# Patient Record
Sex: Female | Born: 1950 | Hispanic: No | Marital: Married | Smoking: Never smoker
Health system: Southern US, Community
[De-identification: ages and names within clinical notes are randomized; demographics above are authoritative.]

## PROBLEM LIST (undated history)

## (undated) DIAGNOSIS — N200 Calculus of kidney: Secondary | ICD-10-CM

---

## 2014-11-17 ENCOUNTER — Encounter (HOSPITAL_COMMUNITY): Payer: Self-pay | Admitting: Emergency Medicine

## 2014-11-17 ENCOUNTER — Emergency Department (HOSPITAL_COMMUNITY)
Admission: EM | Admit: 2014-11-17 | Discharge: 2014-11-17 | Disposition: A | Discharge status: Home / Self Care | Payer: Self-pay | Attending: Emergency Medicine | Admitting: Emergency Medicine

## 2014-11-17 ENCOUNTER — Emergency Department (HOSPITAL_COMMUNITY): Payer: Self-pay

## 2014-11-17 DIAGNOSIS — R5383 Other fatigue: Secondary | ICD-10-CM | POA: Insufficient documentation

## 2014-11-17 DIAGNOSIS — R42 Dizziness and giddiness: Secondary | ICD-10-CM | POA: Insufficient documentation

## 2014-11-17 DIAGNOSIS — Z87442 Personal history of urinary calculi: Secondary | ICD-10-CM | POA: Insufficient documentation

## 2014-11-17 HISTORY — DX: Calculus of kidney: N20.0

## 2014-11-17 LAB — CBC WITH DIFFERENTIAL/PLATELET
BASOS ABS: 0 10*3/uL (ref 0.0–0.1)
Basophils Relative: 0 % (ref 0–1)
Eosinophils Absolute: 0.1 10*3/uL (ref 0.0–0.7)
Eosinophils Relative: 1 % (ref 0–5)
HEMATOCRIT: 39.5 % (ref 36.0–46.0)
Hemoglobin: 13.5 g/dL (ref 12.0–15.0)
LYMPHS ABS: 1.7 10*3/uL (ref 0.7–4.0)
LYMPHS PCT: 19 % (ref 12–46)
MCH: 30.6 pg (ref 26.0–34.0)
MCHC: 34.2 g/dL (ref 30.0–36.0)
MCV: 89.6 fL (ref 78.0–100.0)
MONO ABS: 0.3 10*3/uL (ref 0.1–1.0)
MONOS PCT: 4 % (ref 3–12)
NEUTROS ABS: 6.7 10*3/uL (ref 1.7–7.7)
Neutrophils Relative %: 76 % (ref 43–77)
Platelets: 253 10*3/uL (ref 150–400)
RBC: 4.41 MIL/uL (ref 3.87–5.11)
RDW: 12.2 % (ref 11.5–15.5)
WBC: 8.9 10*3/uL (ref 4.0–10.5)

## 2014-11-17 LAB — BASIC METABOLIC PANEL
ANION GAP: 8 (ref 5–15)
BUN: 10 mg/dL (ref 6–20)
CHLORIDE: 108 mmol/L (ref 101–111)
CO2: 24 mmol/L (ref 22–32)
Calcium: 9.2 mg/dL (ref 8.9–10.3)
Creatinine, Ser: 0.93 mg/dL (ref 0.44–1.00)
GFR calc Af Amer: >60 mL/min (ref >60)
GFR calc non Af Amer: >60 mL/min (ref >60)
GLUCOSE: 118 mg/dL — AB (ref 65–99)
POTASSIUM: 4 mmol/L (ref 3.5–5.1)
Sodium: 140 mmol/L (ref 135–145)

## 2014-11-17 LAB — HEPATIC FUNCTION PANEL
ALK PHOS: 62 U/L (ref 38–126)
ALT: 14 U/L (ref 14–54)
AST: 22 U/L (ref 15–41)
Albumin: 4 g/dL (ref 3.5–5.0)
BILIRUBIN INDIRECT: 0.7 mg/dL (ref 0.3–0.9)
Bilirubin, Direct: 0.2 mg/dL (ref 0.1–0.5)
TOTAL PROTEIN: 7.1 g/dL (ref 6.5–8.1)
Total Bilirubin: 0.9 mg/dL (ref 0.3–1.2)

## 2014-11-17 LAB — I-STAT TROPONIN, ED: Troponin i, poc: 0 ng/mL (ref 0.00–0.08)

## 2014-11-17 MED ORDER — ONDANSETRON HCL 4 MG/2ML IJ SOLN
4.0000 mg | Freq: Once | INTRAMUSCULAR | Status: DC
  Filled 2014-11-17: qty 2

## 2014-11-17 MED ORDER — SODIUM CHLORIDE 0.9 % IV BOLUS (SEPSIS): 1000.0000 mL | Freq: Once | INTRAVENOUS | Status: DC

## 2014-11-17 MED ORDER — SODIUM CHLORIDE 0.9 % IV BOLUS (SEPSIS)
1000.0000 mL | Freq: Once | INTRAVENOUS | Status: AC
  Administered 2014-11-17: 1000 mL via INTRAVENOUS

## 2014-11-17 NOTE — ED Notes (Signed)
Pt arrives POV from home with c/o fatigue and weakness off and on for the last 4 months. This AM was in the shower, began feeling dizzy and nearly passed out. Pt alert, oriented x4, NAd at present.

## 2014-11-17 NOTE — ED Notes (Signed)
Pt reports feeling weak for several months. Pt normally wakes up, meditates then showers before eating. Today patient almost passed out in the shower. Pt reports tingling to back of head for three months. Pt also reports palpitations and feeling nervous all the time. [place] and wanted to get checked out prior to her next trip. Pt also concerned that two family members had the same symptoms and were diagnosed with blood cancer.

## 2014-11-17 NOTE — Discharge Instructions (Signed)
Follow up with [facility] for further evaluation. Refer to attached documents for more information. Return to the ED with worsening or concerning symptoms.

## 2014-11-17 NOTE — ED Provider Notes (Signed)
CSN: [id number]     Arrival date & time 11/17/14  1191 History   First MD Initiated Contact with Patient 11/17/14 0957     Chief Complaint  Patient presents with  . Fatigue  . Near Syncope     (Consider location/radiation/quality/duration/timing/severity/associated sxs/prior Treatment) Patient is a 64 y.o. female presenting with dizziness. The history is provided by the patient. No language interpreter was used.  Dizziness Quality:  Lightheadedness Severity:  Moderate Onset quality:  Sudden Duration:  4 months Timing:  Intermittent Progression:  Unchanged Chronicity:  New Context: standing up   Relieved by:  Lying down Worsened by:  Standing up Ineffective treatments:  None tried Associated symptoms: no chest pain, no diarrhea, no nausea, no palpitations, no shortness of breath, no vomiting and no weakness   Risk factors: no anemia, no heart disease, no hx of stroke, no hx of vertigo, no Meniere's disease, no multiple medications and no new medications     Past Medical History  Diagnosis Date  . Kidney stone    No past surgical history on file. No family history on file. Social History  Substance Use Topics  . Smoking status: Never Smoker   . Smokeless tobacco: Not on file  . Alcohol Use: No   OB History    No data available     Review of Systems  Constitutional: Positive for fatigue. Negative for fever and chills.  HENT: Negative for trouble swallowing.   Eyes: Negative for visual disturbance.  Respiratory: Negative for shortness of breath.   Cardiovascular: Negative for chest pain and palpitations.  Gastrointestinal: Negative for nausea, vomiting, abdominal pain and diarrhea.  Genitourinary: Negative for dysuria and difficulty urinating.  Musculoskeletal: Negative for arthralgias and neck pain.  Skin: Negative for color change.  Neurological: Positive for light-headedness. Negative for dizziness and weakness.  Psychiatric/Behavioral: Negative for dysphoric  mood.      Allergies  Review of patient's allergies indicates no known allergies.  Home Medications   Prior to Admission medications   Not on File   Ht 5\' 3"  (1.6 m)  Wt 160 lb (72.576 kg)  BMI 28.35 kg/m2 Physical Exam  Constitutional: She is oriented to person, place, and time. She appears well-developed and well-nourished. No distress.  HENT:  Head: Normocephalic and atraumatic.  Eyes: Conjunctivae and EOM are normal. Pupils are equal, round, and reactive to light.  Neck: Normal range of motion.  Cardiovascular: Normal rate and regular rhythm.  Exam reveals no gallop and no friction rub.   No murmur heard. Pulmonary/Chest: Effort normal and breath sounds normal. She has no wheezes. She has no rales. She exhibits no tenderness.  Abdominal: Soft. She exhibits no distension. There is no tenderness. There is no rebound.  Musculoskeletal: Normal range of motion.  Neurological: She is alert and oriented to person, place, and time. No cranial nerve deficit. Coordination normal.  Speech is goal-oriented. Moves limbs without ataxia.   Skin: Skin is warm and dry.  Psychiatric: She has a normal mood and affect. Her behavior is normal.  Nursing note and vitals reviewed.   ED Course  Procedures (including critical care time) Labs Review Labs Reviewed  BASIC METABOLIC PANEL - Abnormal; Notable for the following:    Glucose, Bld 118 (*)    All other components within normal limits  CBC WITH DIFFERENTIAL/PLATELET  HEPATIC FUNCTION PANEL  I-STAT TROPOININ, ED  I-STAT TROPOININ, ED    Imaging Review Ct Head Wo Contrast  11/17/2014   CLINICAL DATA:  Fatigue  and weakness for 4 months. Dizziness and near syncope in the shower this morning. Tingling sensation along the posterior head.  EXAM: CT HEAD WITHOUT CONTRAST  TECHNIQUE: Contiguous axial images were obtained from the base of the skull through the vertex without intravenous contrast.  COMPARISON:  None.  FINDINGS: Globus pallidus  nucleus calcification bilaterally, likely physiologic.  Otherwise, the brainstem, cerebellum, cerebral peduncles, thalamus, basal ganglia, basilar cisterns, and ventricular system appear within normal limits. No intracranial hemorrhage, mass lesion, or acute CVA. Chronic bilateral ethmoid, maxillary, sphenoid, and frontal sinusitis.  IMPRESSION: 1. Mild chronic paranasal sinusitis. 2. No significant intracranial abnormality to explain the patient's symptoms.   Electronically Signed   By: Gaylyn Rong M.D.   On: 11/17/2014 11:29   I have personally reviewed and evaluated these images and lab results as part of my medical decision-making.   EKG Interpretation   Date/Time:  Thursday November 17 2014 09:45:56 EDT Ventricular Rate:  79 PR Interval:  137 QRS Duration: 80 QT Interval:  369 QTC Calculation: 423 R Axis:   48 Text Interpretation:  Sinus rhythm Normal ECG No previous tracing  Confirmed by Denton Lank  MD, Caryn Bee ([id number]) on 11/17/2014 9:54:27 AM      MDM   Final diagnoses:  Positional lightheadedness   12:05 PM Patient's symptoms appears to be positional and likely related to orthostatic hypotension. Patient's labs and CT unremarkable for acute changes. Orthostatic vital signs suggest some hypovolemia. Patient will have 1 liter bolus and follow up with [facility]. Patient recommended to slightly increase dietary salt intake and to take her time when getting up from a seated or lying position. No neuro deficits.     Sabrina Beck, PA-C 11/17/14 1249  Cathren Laine, MD 11/18/14 [phone]

## 2016-08-09 IMAGING — CT CT HEAD W/O CM
1 of 2 series · 16 of 30 positions shown, 20 images · non-contrast
Comparison: None.

CLINICAL DATA: Fatigue and weakness for 4 months. Dizziness and
near syncope in the shower this morning. Tingling sensation along
the posterior head.

EXAM:
CT HEAD WITHOUT CONTRAST
TECHNIQUE: Contiguous axial images were obtained from the base of the skull
through the vertex without intravenous contrast.

[Series 3: head 2.0 h70h · axial · 0.40mm/px · z∈[+1300,+1438]mm · 16 of 77 slices shown, 20 images]
[im 4/77  brain]
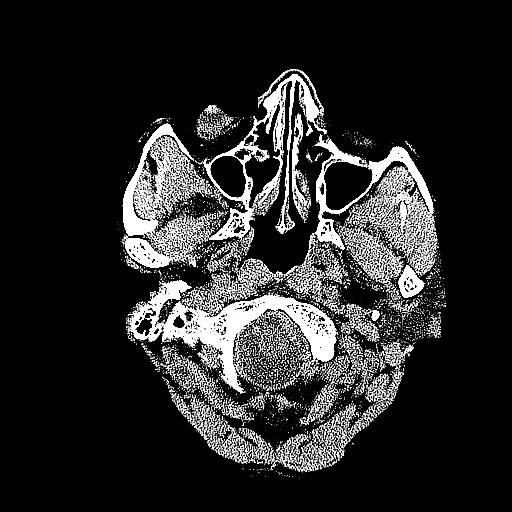
[im 4/77  bone]
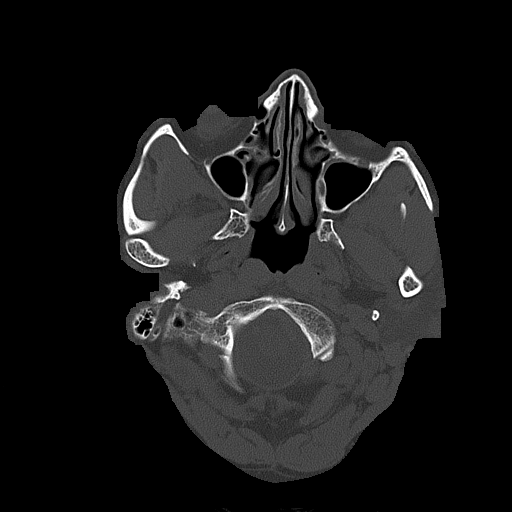
[im 8/77  brain]
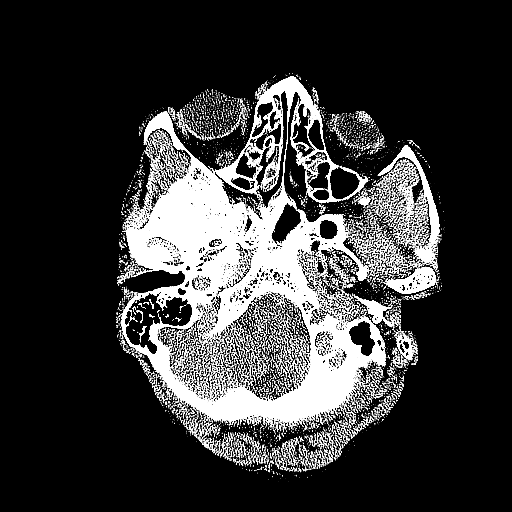
[im 12/77  brain]
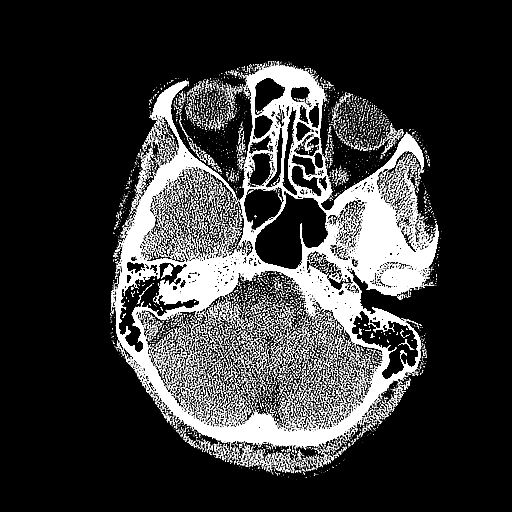
[im 20/77  brain]
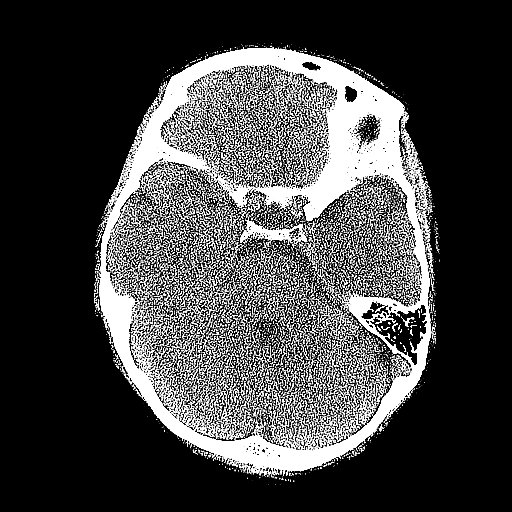
[im 23/77  brain]
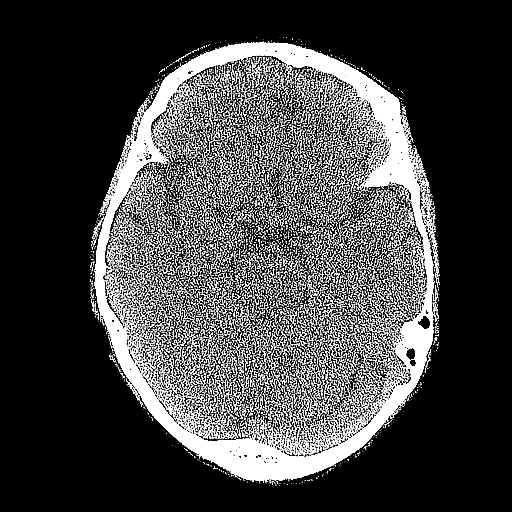
[im 23/77  bone]
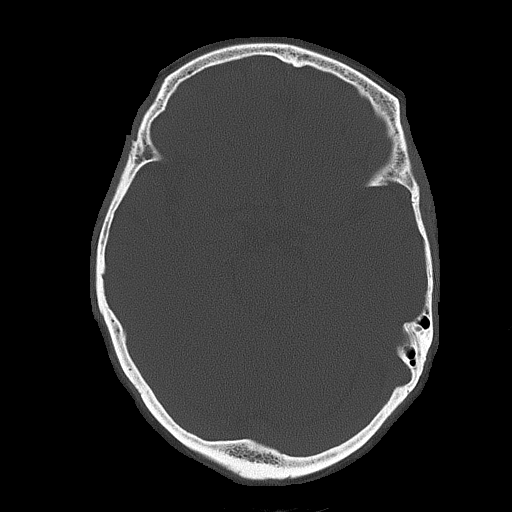
[im 27/77  brain]
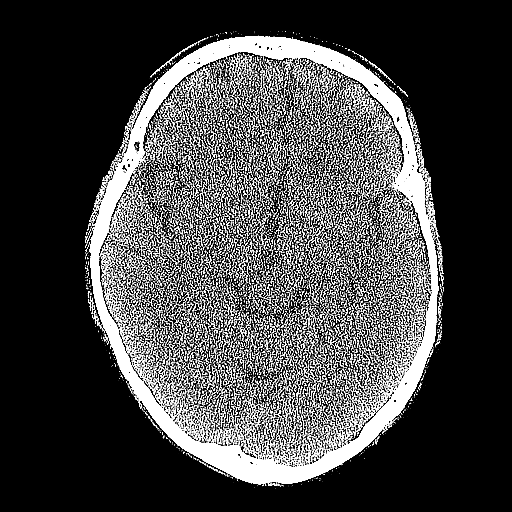
[im 31/77  brain]
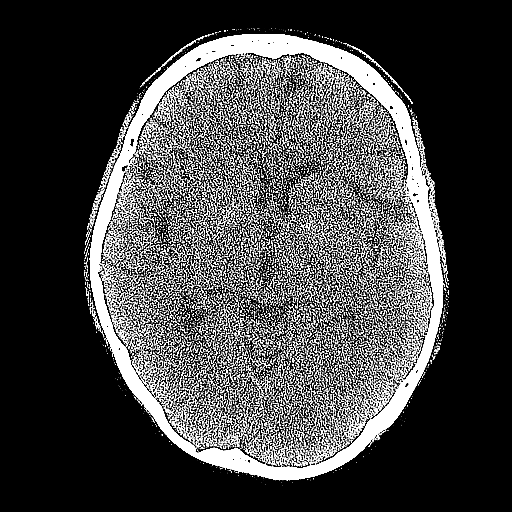
[im 35/77  brain]
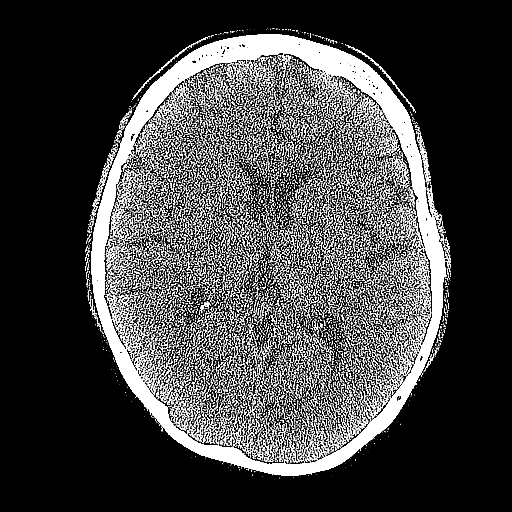
[im 42/77  brain]
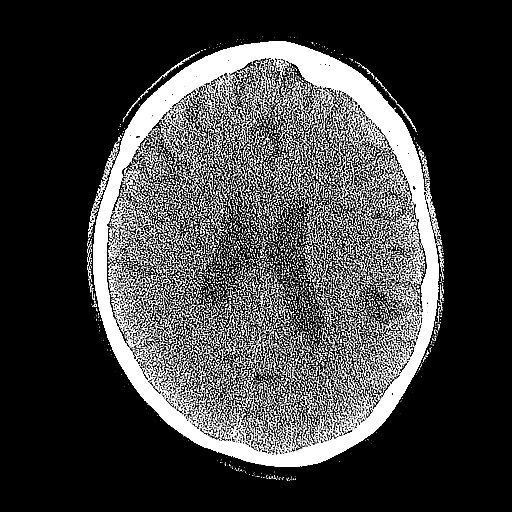
[im 42/77  bone]
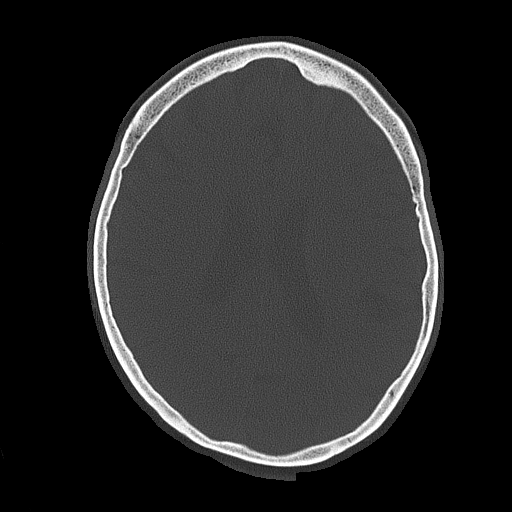
[im 46/77  brain]
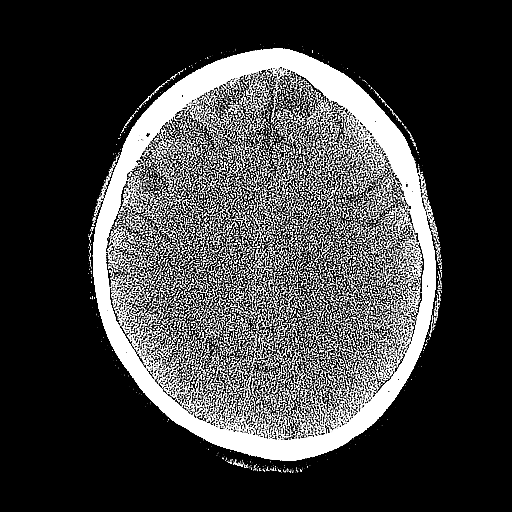
[im 50/77  brain]
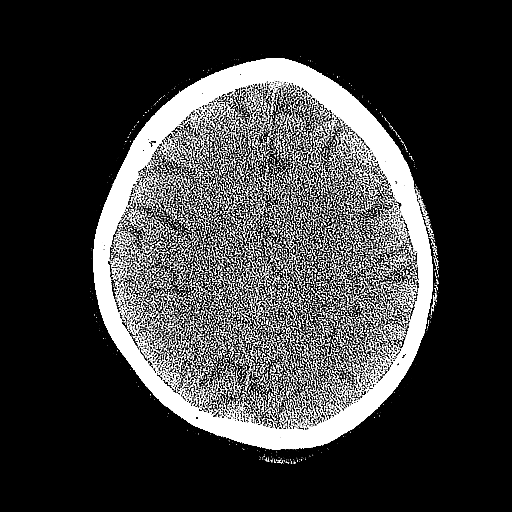
[im 54/77  brain]
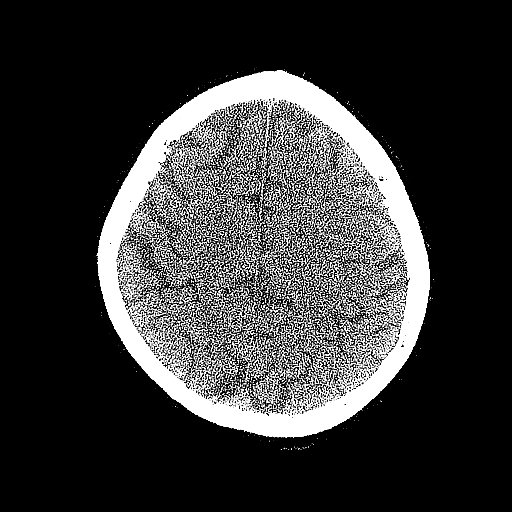
[im 58/77  brain]
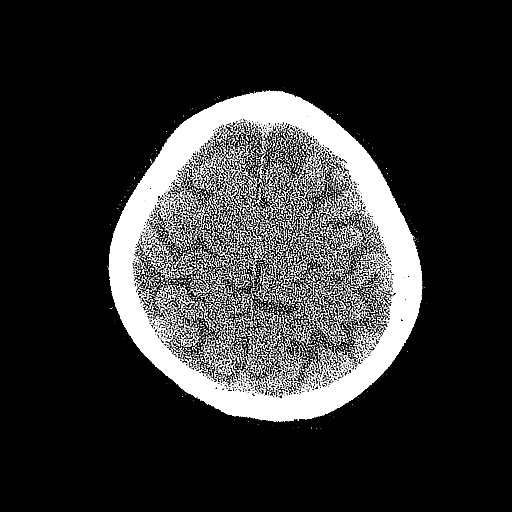
[im 58/77  bone]
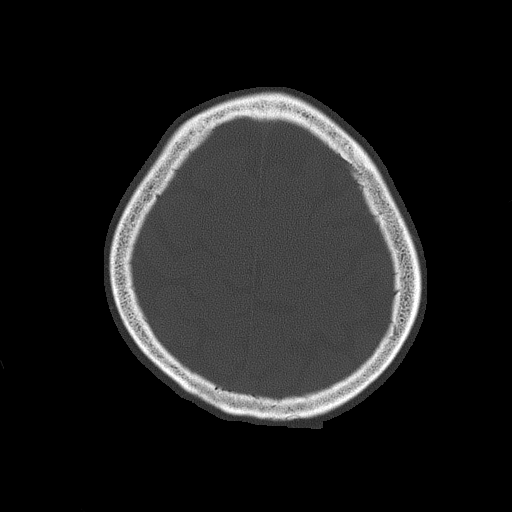
[im 65/77  brain]
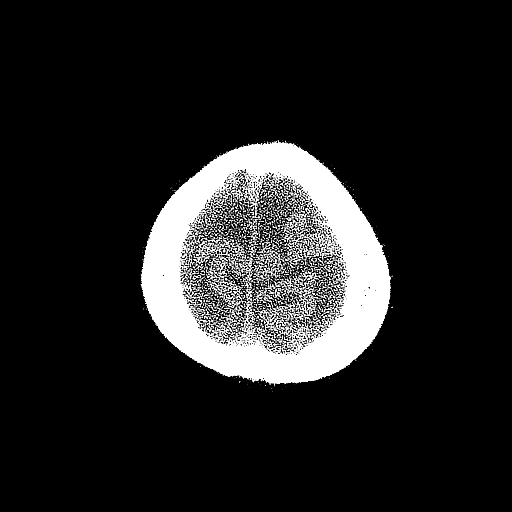
[im 69/77  brain]
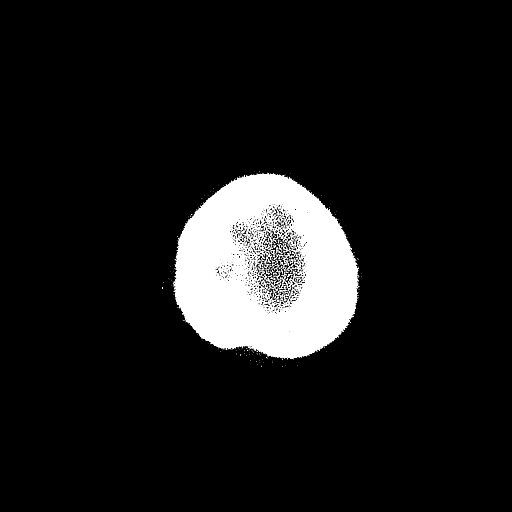
[im 73/77  brain]
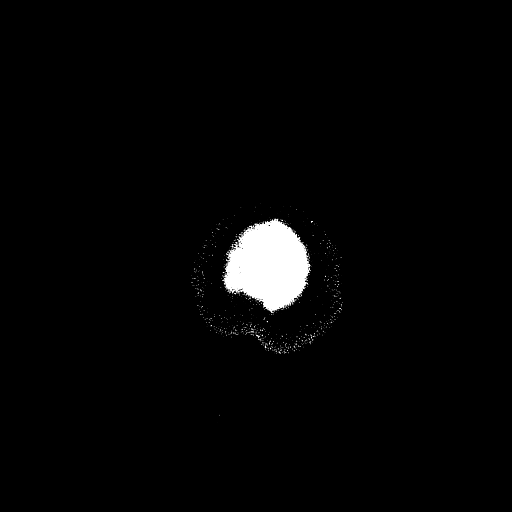

[16 of 30 positions shown; findings below may reference images not displayed]

FINDINGS: Globus pallidus nucleus calcification bilaterally, likely
physiologic.

Otherwise, the brainstem, cerebellum, cerebral peduncles, thalamus,
basal ganglia, basilar cisterns, and ventricular system appear
within normal limits. No intracranial hemorrhage, mass lesion, or
acute CVA. Chronic bilateral ethmoid, maxillary, sphenoid, and
frontal sinusitis.
IMPRESSION: 1. Mild chronic paranasal sinusitis.
2. No significant intracranial abnormality to explain the patient's
symptoms.
